# Patient Record
Sex: Female | Born: 1970 | Race: White | Hispanic: No | Smoking: Current every day smoker
Health system: Southern US, Community
[De-identification: ages and names within clinical notes are randomized; demographics above are authoritative.]

## PROBLEM LIST (undated history)

## (undated) DIAGNOSIS — E7800 Pure hypercholesterolemia, unspecified: Secondary | ICD-10-CM

## (undated) DIAGNOSIS — I10 Essential (primary) hypertension: Secondary | ICD-10-CM

## (undated) DIAGNOSIS — I219 Acute myocardial infarction, unspecified: Secondary | ICD-10-CM

## (undated) DIAGNOSIS — M5030 Other cervical disc degeneration, unspecified cervical region: Secondary | ICD-10-CM

## (undated) DIAGNOSIS — I2510 Atherosclerotic heart disease of native coronary artery without angina pectoris: Secondary | ICD-10-CM

---

## 2019-08-26 ENCOUNTER — Other Ambulatory Visit: Payer: Self-pay

## 2019-08-26 ENCOUNTER — Encounter: Payer: Self-pay | Admitting: Emergency Medicine

## 2019-08-26 ENCOUNTER — Emergency Department: Payer: Medicare HMO

## 2019-08-26 ENCOUNTER — Emergency Department
Admission: EM | Admit: 2019-08-26 | Discharge: 2019-08-26 | Disposition: A | Discharge status: Home / Self Care | Payer: Medicare HMO | Attending: Student | Admitting: Student

## 2019-08-26 DIAGNOSIS — F17210 Nicotine dependence, cigarettes, uncomplicated: Secondary | ICD-10-CM | POA: Diagnosis not present

## 2019-08-26 DIAGNOSIS — Z7982 Long term (current) use of aspirin: Secondary | ICD-10-CM | POA: Diagnosis not present

## 2019-08-26 DIAGNOSIS — S199XXA Unspecified injury of neck, initial encounter: Secondary | ICD-10-CM | POA: Diagnosis present

## 2019-08-26 DIAGNOSIS — Y93I9 Activity, other involving external motion: Secondary | ICD-10-CM | POA: Diagnosis not present

## 2019-08-26 DIAGNOSIS — I10 Essential (primary) hypertension: Secondary | ICD-10-CM | POA: Insufficient documentation

## 2019-08-26 DIAGNOSIS — I259 Chronic ischemic heart disease, unspecified: Secondary | ICD-10-CM | POA: Insufficient documentation

## 2019-08-26 DIAGNOSIS — S161XXA Strain of muscle, fascia and tendon at neck level, initial encounter: Secondary | ICD-10-CM | POA: Diagnosis not present

## 2019-08-26 DIAGNOSIS — Y999 Unspecified external cause status: Secondary | ICD-10-CM | POA: Diagnosis not present

## 2019-08-26 DIAGNOSIS — Y92410 Unspecified street and highway as the place of occurrence of the external cause: Secondary | ICD-10-CM | POA: Diagnosis not present

## 2019-08-26 HISTORY — DX: Essential (primary) hypertension: I10

## 2019-08-26 HISTORY — DX: Pure hypercholesterolemia, unspecified: E78.00

## 2019-08-26 HISTORY — DX: Atherosclerotic heart disease of native coronary artery without angina pectoris: I25.10

## 2019-08-26 HISTORY — DX: Other cervical disc degeneration, unspecified cervical region: M50.30

## 2019-08-26 HISTORY — DX: Acute myocardial infarction, unspecified: I21.9

## 2019-08-26 MED ORDER — NAPROXEN 500 MG PO TABS: 500.0000 mg | ORAL_TABLET | Freq: Two times a day (BID) | ORAL | 0 refills | Status: AC

## 2019-08-26 MED ORDER — CYCLOBENZAPRINE HCL 10 MG PO TABS: 10.0000 mg | ORAL_TABLET | Freq: Three times a day (TID) | ORAL | 0 refills | Status: AC | PRN
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
## 2019-08-26 NOTE — Discharge Instructions (Signed)
Please follow-up with the primary care provider of your choice for symptoms that are not improving over the week.  Return to the emergency department for symptoms change or worsen if you are unable to schedule appointment.
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
